# Patient Record
Sex: Male | Born: 1935 | Race: Black or African American | Hispanic: No | Marital: Married | Smoking: Never smoker
Health system: Southern US, Community
[De-identification: ages and names within clinical notes are randomized; demographics above are authoritative.]

## PROBLEM LIST (undated history)

## (undated) ENCOUNTER — Emergency Department (HOSPITAL_BASED_OUTPATIENT_CLINIC_OR_DEPARTMENT_OTHER): Admission: EM | Payer: Self-pay | Source: Home / Self Care

## (undated) DIAGNOSIS — Z9889 Other specified postprocedural states: Secondary | ICD-10-CM

## (undated) DIAGNOSIS — K469 Unspecified abdominal hernia without obstruction or gangrene: Secondary | ICD-10-CM

## (undated) DIAGNOSIS — R519 Headache, unspecified: Secondary | ICD-10-CM

## (undated) DIAGNOSIS — I2510 Atherosclerotic heart disease of native coronary artery without angina pectoris: Secondary | ICD-10-CM

## (undated) DIAGNOSIS — J189 Pneumonia, unspecified organism: Secondary | ICD-10-CM

## (undated) DIAGNOSIS — R112 Nausea with vomiting, unspecified: Secondary | ICD-10-CM

## (undated) DIAGNOSIS — R51 Headache: Secondary | ICD-10-CM

## (undated) DIAGNOSIS — Z973 Presence of spectacles and contact lenses: Secondary | ICD-10-CM

## (undated) DIAGNOSIS — I10 Essential (primary) hypertension: Secondary | ICD-10-CM

## (undated) DIAGNOSIS — K4030 Unilateral inguinal hernia, with obstruction, without gangrene, not specified as recurrent: Secondary | ICD-10-CM

## (undated) DIAGNOSIS — K4090 Unilateral inguinal hernia, without obstruction or gangrene, not specified as recurrent: Secondary | ICD-10-CM

## (undated) DIAGNOSIS — M1990 Unspecified osteoarthritis, unspecified site: Secondary | ICD-10-CM

## (undated) HISTORY — PX: HERNIA REPAIR: SHX51

## (undated) HISTORY — PX: COLONOSCOPY: SHX174

---

## 2001-03-02 ENCOUNTER — Ambulatory Visit (HOSPITAL_COMMUNITY): Admission: RE | Admit: 2001-03-02 | Discharge: 2001-03-02 | Payer: Self-pay | Admitting: Gastroenterology

## 2016-07-06 ENCOUNTER — Encounter (HOSPITAL_COMMUNITY): Payer: Self-pay | Admitting: Emergency Medicine

## 2016-07-06 ENCOUNTER — Emergency Department (HOSPITAL_COMMUNITY)
Admission: EM | Admit: 2016-07-06 | Discharge: 2016-07-07 | Disposition: A | Discharge status: Home / Self Care | Payer: Medicare Other | Attending: Emergency Medicine | Admitting: Emergency Medicine

## 2016-07-06 DIAGNOSIS — K4090 Unilateral inguinal hernia, without obstruction or gangrene, not specified as recurrent: Secondary | ICD-10-CM | POA: Insufficient documentation

## 2016-07-06 DIAGNOSIS — R109 Unspecified abdominal pain: Secondary | ICD-10-CM | POA: Diagnosis present

## 2016-07-06 DIAGNOSIS — I10 Essential (primary) hypertension: Secondary | ICD-10-CM | POA: Insufficient documentation

## 2016-07-06 DIAGNOSIS — I2510 Atherosclerotic heart disease of native coronary artery without angina pectoris: Secondary | ICD-10-CM | POA: Diagnosis not present

## 2016-07-06 HISTORY — DX: Unspecified abdominal hernia without obstruction or gangrene: K46.9

## 2016-07-06 HISTORY — DX: Essential (primary) hypertension: I10

## 2016-07-06 HISTORY — DX: Atherosclerotic heart disease of native coronary artery without angina pectoris: I25.10

## 2016-07-06 LAB — CBC WITH DIFFERENTIAL/PLATELET
BASOS ABS: 0 10*3/uL (ref 0.0–0.1)
Basophils Relative: 0 %
Eosinophils Absolute: 0.2 10*3/uL (ref 0.0–0.7)
Eosinophils Relative: 5 %
HEMATOCRIT: 38 % — AB (ref 39.0–52.0)
Hemoglobin: 12.3 g/dL — ABNORMAL LOW (ref 13.0–17.0)
LYMPHS PCT: 42 %
Lymphs Abs: 2.2 10*3/uL (ref 0.7–4.0)
MCH: 27.9 pg (ref 26.0–34.0)
MCHC: 32.4 g/dL (ref 30.0–36.0)
MCV: 86.2 fL (ref 78.0–100.0)
MONO ABS: 0.3 10*3/uL (ref 0.1–1.0)
Monocytes Relative: 6 %
NEUTROS ABS: 2.5 10*3/uL (ref 1.7–7.7)
Neutrophils Relative %: 47 %
Platelets: 167 10*3/uL (ref 150–400)
RBC: 4.41 MIL/uL (ref 4.22–5.81)
RDW: 13.5 % (ref 11.5–15.5)
WBC: 5.3 10*3/uL (ref 4.0–10.5)

## 2016-07-06 MED ORDER — FENTANYL CITRATE (PF) 100 MCG/2ML IJ SOLN: 50.0000 ug | Freq: Once | INTRAMUSCULAR | Status: DC

## 2016-07-06 MED ORDER — ONDANSETRON HCL 4 MG/2ML IJ SOLN: 4.0000 mg | Freq: Once | INTRAMUSCULAR | Status: DC

## 2016-07-06 NOTE — ED Triage Notes (Signed)
Pt. Stated, I' have a hernia and its ruptured . Ive been nausea and vomiting some. This has been going on for about 2 weeks.

## 2016-07-06 NOTE — ED Notes (Signed)
Lisa PA at bedside

## 2016-07-06 NOTE — ED Provider Notes (Signed)
[facility]-EMERGENCY DEPT Provider Note   CSN: [id number] Arrival date & time: 07/06/16  1821     History   Chief Complaint Chief Complaint  Patient presents with  . Inguinal Hernia  . Emesis  . Abdominal Pain    HPI Reginald Garrison is a 83 y.o. male.  The history is provided by the patient and medical records.  Emesis   Associated symptoms include abdominal pain.  Abdominal Pain   Associated symptoms include vomiting.     83 y.o. M with hx of CAD, hernia, HTN, presenting to the ED for Evaluation of right inguinal hernia and vomiting. Patient reports hernia has been present for several months but has been increasing in size. States he has been wearing a hernia belt to help with this. States if he does not wear it the pain is unbearable. He has not yet sought evaluation with general surgery for this. States over the past 2 weeks he has had some intermittent nausea and vomiting. States this is random when it occurs, not necessarily associated with eating. He has not had any diarrhea. No fever or chills. States he called his PCP about this and was told to come to the ED for evaluation. Does have history of left inguinal hernia repair about 20 years ago in [place]. No other abdominal surgeries.  Past Medical History:  Diagnosis Date  . Coronary artery disease   . Hernia of abdominal cavity   . Hypertension     There are no active problems to display for this patient.   History reviewed. No pertinent surgical history.     Home Medications    Prior to Admission medications   Not on File    Family History No family history on file.  Social History Social History  Substance Use Topics  . Smoking status: Never Smoker  . Smokeless tobacco: Never Used  . Alcohol use No     Allergies   Patient has no allergy information on record.   Review of Systems Review of Systems  Gastrointestinal: Positive for abdominal pain and vomiting.  All other systems reviewed and  are negative.    Physical Exam Updated Vital Signs BP (!) 160/96   Pulse 64   Temp 98.4 F (36.9 C) (Oral)   Resp 17   Ht  (1.753 m)   Wt 72.6 kg   SpO2 100%   BMI 23.63 kg/m   Physical Exam  Constitutional: He is oriented to person, place, and time. He appears well-developed and well-nourished.  HENT:  Head: Normocephalic and atraumatic.  Mouth/Throat: Oropharynx is clear and moist.  Eyes: Conjunctivae and EOM are normal. Pupils are equal, round, and reactive to light.  Neck: Normal range of motion.  Cardiovascular: Normal rate, regular rhythm and normal heart sounds.   Pulmonary/Chest: Effort normal and breath sounds normal. No respiratory distress. He has no wheezes.  Abdominal: Soft. Bowel sounds are normal. There is no tenderness. There is no rebound. A hernia is present. Hernia confirmed positive in the right inguinal area.  Right inguinal hernia present, seems reducible, locally tender, no overlying skin changes Remainder of abdomen is soft and overall benign, no distention noted, bowel sounds decreased  Musculoskeletal: Normal range of motion.  Neurological: He is alert and oriented to person, place, and time.  Skin: Skin is warm and dry.  Psychiatric: He has a normal mood and affect.  Nursing note and vitals reviewed.    ED Treatments / Results  Labs (all labs ordered are  listed, but only abnormal results are displayed) Labs Reviewed  CBC WITH DIFFERENTIAL/PLATELET - Abnormal; Notable for the following:       Result Value   Hemoglobin 12.3 (*)    HCT 38.0 (*)    All other components within normal limits  COMPREHENSIVE METABOLIC PANEL - Abnormal; Notable for the following:    Glucose, Bld 101 (*)    Creatinine, Ser 1.32 (*)    Alkaline Phosphatase 278 (*)    GFR calc non Af Amer 48 (*)    GFR calc Af Amer 55 (*)    All other components within normal limits  LIPASE, BLOOD    EKG  EKG Interpretation None       Radiology Ct Abdomen Pelvis W  Contrast  Result Date: 07/07/2016 CLINICAL DATA:  Initial evaluation for right inguinal hernia, vomiting. EXAM: CT ABDOMEN AND PELVIS WITH CONTRAST TECHNIQUE: Multidetector CT imaging of the abdomen and pelvis was performed using the standard protocol following bolus administration of intravenous contrast. CONTRAST:  ISOVUE-300 IOPAMIDOL (ISOVUE-300) INJECTION 61% COMPARISON:  None. FINDINGS: Lower chest: Scattered atelectatic changes and/or fibrosis present within the visualized lung bases. 4 mm nodule present within the right lower lobe (series 4, image 5). Visualized lung bases are otherwise clear. Hepatobiliary: 12 mm hypodensity within the left hepatic lobe noted, likely a small cyst. Section 4.8 cm cyst present within the inferior right hepatic lobe. Few additional tiny subcentimeter hypodensities too small the characterize, but may reflect small cysts as well. Liver otherwise unremarkable. Gallbladder within normal limits. No biliary dilatation. Pancreas: Pancreas within normal limits. Spleen: Spleen within normal limits. Adrenals/Urinary Tract: Kidneys equal in size with symmetric enhancement. Multiple bilateral renal cysts noted. No nephrolithiasis or hydronephrosis. No focal enhancing renal mass. No hydroureter. Bladder within normal limits. Stomach/Bowel: Stomach within normal limits. Small bowel of normal caliber without acute inflammatory changes. No evidence for small bowel obstruction. Right inguinal hernia containing a portion of colon and loop of ileum present within the right inguinal region (series 3, image 90). No associated obstruction. Small amount of probable free fluid within the hernia sac without additional inflammatory changes. No abnormal wall thickening, mucosal enhancement, or inflammatory fat stranding seen about the bowels. Moderate retained stool within the colon, which may reflect constipation. Vascular/Lymphatic: Extensive aorto bi-iliac atherosclerotic disease. Aneurysmal  dilatation of the infrarenal aorta up to 3.7 cm in diameter. Aneurysm dilatation of the right common iliac artery up to 2.3 cm. Left common iliac artery dilated up to 2.4 cm. No pathologically enlarged intra-abdominal or pelvic lymph nodes. Reproductive: Prostate within normal limits. Other: No free air or fluid. Musculoskeletal: No acute osseous abnormality. No worrisome lytic or blastic osseous lesions. Asymmetric coarse trabecular thickening involving the left hemipelvis most likely related to underlying Paget's disease. Similar changes involve the L3 vertebral body as well as its posterior elements. Height loss at the L3 level appears chronic. No associated soft tissue mass to suggest malignant degeneration. IMPRESSION: 1. Right inguinal hernia containing loops of colon and ileum without associated obstruction. Probable small amount of associated free fluid within the hernia sac without additional inflammatory changes. 2. No other acute intra-abdominal or pelvic process identified. 3. **An incidental finding of potential clinical significance has been found. Aneurysmal dilatation of the infrarenal aorta up to 3.7 cm. Recommend followup by ultrasound in 2 years. This recommendation follows ACR consensus guidelines: White Paper of the ACR Incidental Findings Committee II on Vascular Findings. J Am Coll Radiol 2013; 10:789-794.** 4. Additional bilateral common iliac  artery aneurysms as above. 5. Moderate amount of retained stool within the colon, which may reflect constipation. 6. Scattered cysts involving the liver and bilateral kidneys as above. 7. Pagetoid changes involving the left hemipelvis and L3 vertebral body. Height loss at the L3 level appears chronic. 8. 4 mm right lower lobe pulmonary nodule, indeterminate. No follow-up needed if patient is low-risk. Non-contrast chest CT can be considered in 12 months if patient is high-risk. This recommendation follows the consensus statement: Guidelines for  Management of Incidental Pulmonary Nodules Detected on CT Images: From the Fleischner Society 2017; Radiology 2017; 284:228-243. Electronically Signed   By: Rise Mu M.D.   On: 07/07/2016 01:55    Procedures Procedures (including critical care time)  Medications Ordered in ED Medications  fentaNYL (SUBLIMAZE) injection 50 mcg (50 mcg Intravenous Refused 07/07/16 0019)  ondansetron (ZOFRAN) injection 4 mg (4 mg Intravenous Refused 07/07/16 0019)  iopamidol (ISOVUE-300) 61 % injection (100 mLs  Contrast Given 07/07/16 0109)  diphenhydrAMINE (BENADRYL) injection 25 mg (25 mg Intravenous Given 07/07/16 0127)  methylPREDNISolone sodium succinate (SOLU-MEDROL) 125 mg/2 mL injection 125 mg (125 mg Intravenous Given 07/07/16 0127)     Initial Impression / Assessment and Plan / ED Course  I have reviewed the triage vital signs and the nursing notes.  Pertinent labs & imaging results that were available during my care of the patient were reviewed by me and considered in my medical decision making (see chart for details).  83 year old male with right inguinal hernia. Reports this has been increasing over the past 2 months. Reports some intermittent nausea and vomiting over the past 2 weeks. Sent here by PCP for further evaluation. Here patient is afebrile and nontoxic. His hernia is sizable but is reducible and soft in exam. No overlying skin changes. Remainder of abdomen is soft and benign. Screening lab work is overall reassuring. CT scan pending for evaluation of possible obstruction/strangulation.  1:25 AM Called into patient's room after CT.  He has some hives of his face and neck.  He denies any difficulty swallowing or sensation of throat closing. His airway remains widely patent.  He is handling secretions well, normal phonation without stridor.  VSS.  No signs of anaphylaxis at this time.  Will give IV solu-medrol and benadryl.  Will monitor closely.  CT scan resulted-- hernia does  contain loops of bowel but no signs of obstruction or strangulation.  No inflammatory changes.  Has some other incidental findings-- see above.  Patient remains comfortable here, actually refused pain medication. After his IV Solu-Medrol and Benadryl, hives have resolved.  Monitored here 2 hours post contrast administration and he remains without any airway compromise or oral swelling.  Feel he is stable for discharge.  Will have him follow-up with the general surgery clinic.  Rx percocet, zofran PRN.  Encouraged to follow-up with PCP as well.  Discussed plan with patient and family, they acknowledged understanding and agreed with plan of care.  Return precautions given for new or worsening symptoms.  Final Clinical Impressions(s) / ED Diagnoses   Final diagnoses:  Inguinal hernia of right side without obstruction or gangrene    New Prescriptions Discharge Medication List as of 07/07/2016  3:30 AM    START taking these medications   Details  ondansetron (ZOFRAN ODT) 4 MG disintegrating tablet Take 1 tablet (4 mg total) by mouth every 8 (eight) hours as needed for nausea., Starting Tue 07/07/2016, Print    oxyCODONE-acetaminophen (PERCOCET) 5-325 MG tablet Take 1 tablet  by mouth every 4 (four) hours as needed., Starting Tue 07/07/2016, Print         Garlon Hatchet, PA-C 07/07/16 0405    Laurence Spates, MD 07/09/16 [phone]

## 2016-07-07 ENCOUNTER — Emergency Department (HOSPITAL_COMMUNITY): Payer: Medicare Other

## 2016-07-07 LAB — COMPREHENSIVE METABOLIC PANEL
ALBUMIN: 3.6 g/dL (ref 3.5–5.0)
ALT: 20 U/L (ref 17–63)
AST: 41 U/L (ref 15–41)
Alkaline Phosphatase: 278 U/L — ABNORMAL HIGH (ref 38–126)
Anion gap: 8 (ref 5–15)
BUN: 20 mg/dL (ref 6–20)
CHLORIDE: 103 mmol/L (ref 101–111)
CO2: 28 mmol/L (ref 22–32)
CREATININE: 1.32 mg/dL — AB (ref 0.61–1.24)
Calcium: 9.5 mg/dL (ref 8.9–10.3)
GFR calc Af Amer: 55 mL/min — ABNORMAL LOW (ref >60)
GFR calc non Af Amer: 48 mL/min — ABNORMAL LOW (ref >60)
Glucose, Bld: 101 mg/dL — ABNORMAL HIGH (ref 65–99)
POTASSIUM: 3.8 mmol/L (ref 3.5–5.1)
SODIUM: 139 mmol/L (ref 135–145)
Total Bilirubin: 0.5 mg/dL (ref 0.3–1.2)
Total Protein: 6.7 g/dL (ref 6.5–8.1)

## 2016-07-07 LAB — LIPASE, BLOOD: LIPASE: 19 U/L (ref 11–51)

## 2016-07-07 MED ORDER — DIPHENHYDRAMINE HCL 50 MG/ML IJ SOLN
25.0000 mg | Freq: Once | INTRAMUSCULAR | Status: AC
  Administered 2016-07-07: 25 mg via INTRAVENOUS

## 2016-07-07 MED ORDER — OXYCODONE-ACETAMINOPHEN 5-325 MG PO TABS: 1.0000 | ORAL_TABLET | ORAL | 0 refills | Status: AC | PRN

## 2016-07-07 MED ORDER — METHYLPREDNISOLONE SODIUM SUCC 125 MG IJ SOLR
125.0000 mg | Freq: Once | INTRAMUSCULAR | Status: AC
  Administered 2016-07-07: 125 mg via INTRAVENOUS

## 2016-07-07 MED ORDER — ONDANSETRON 4 MG PO TBDP: 4.0000 mg | ORAL_TABLET | Freq: Three times a day (TID) | ORAL | 0 refills | Status: AC | PRN

## 2016-07-07 MED ORDER — IOPAMIDOL (ISOVUE-300) INJECTION 61%
INTRAVENOUS | Status: AC
  Administered 2016-07-07: 100 mL
  Filled 2016-07-07: qty 100

## 2016-07-07 MED ORDER — METHYLPREDNISOLONE SODIUM SUCC 125 MG IJ SOLR
INTRAMUSCULAR | Status: AC
  Filled 2016-07-07: qty 2

## 2016-07-07 NOTE — ED Notes (Signed)
Pt returned from CT, redness noted to chest, hives to forehead and cheeks. PA Sanders notified, see new orders.

## 2016-07-07 NOTE — Discharge Instructions (Signed)
Take zofran for nausea.  Take percocet for pain.   Do not drive while taking pain medication, it can make you drowsy. Follow-up with the surgery clinic--- I recommend to call tomorrow.  As we discussed, they may want you to have medical clearance from your doctor but you can discuss this with them at consultation. Return to the ED for new or worsening symptoms.

## 2016-07-07 NOTE — ED Notes (Signed)
Continuing to refuse pain/nausea medications.

## 2016-07-07 NOTE — ED Notes (Signed)
Redness/hives improving. Pt continuing to deny shob

## 2016-07-07 NOTE — ED Notes (Signed)
To CT at this time on cardiac monitor 

## 2016-07-07 NOTE — ED Notes (Signed)
Patient left at this time with all belongings. 

## 2016-07-08 ENCOUNTER — Other Ambulatory Visit: Payer: Self-pay | Admitting: General Surgery

## 2016-07-13 ENCOUNTER — Encounter (HOSPITAL_COMMUNITY): Payer: Self-pay | Admitting: *Deleted

## 2016-07-13 NOTE — Progress Notes (Signed)
Pt denies SOB, chest pain, and being under the care of a cardiologist. Pt stated that he was " released from his cardiologist > 5 years ago." Pt stated that he thinks that the cardiologist name was Dr. Katrinka Blazing. Pt denies having a cardiac cath but stated that if an echo and stress test were performed , " it was well over 5 years ago." Pt stated that his PCP is Dr. Gabriel Earing in [place]. Records requested from PCP and Dr. Katrinka Blazing. Pt made aware to stop taking Aspirin, vitamins, fish oil and herbal medications. Do not take any NSAIDs ie: Ibuprofen, Advil, Naproxen, BC and Goody Powder or any medication containing Aspirin. Pt verbalized understanding of all pre-op instructions.

## 2016-07-15 NOTE — H&P (Signed)
Reginald Garrison Location: [facility] Surgery Patient #: [id number] DOB: Nov 14, 1929 Married / Language: English / Race: Black or African American Male       History of Present Illness        The patient is a 83 year old male who presents with an inguinal hernia. This is a very pleasant 83 year old African-American man, here with his daughter and son. His PCP is Dr. Gabriel Earing in [place]. He was actually referred by Garlon Hatchet, PA, in the emergency department. I do not see that a physician saw the patient       He has a remote history of left inguinal hernia repair 20 years ago. No symptoms there. He's had a bulge in his right groin for many years. He wears a truss. It has slowly gotten larger. He recently had an episode of severe pain nausea and vomiting and went to the emergency department. The hernia was slowly reduced and he felt fine. CT scan showed a right inguinal hernia containing loops of colon and small bowel without obstruction. Incidentally found was infrarenal aorta dilated to 3.7 cm in follow-up in 2 years with ultrasound was recommended. Incidental bilateral common iliac aneurysms noted. 4 mm right lower lobe pulmonary nodule, indeterminate. 12 month follow-up CT if at high risk. Patient feels fine today and has no complaints today. He thought was given to the operation in the office today.      Past history left inguinal hernia. 20 years ago. Hypertension. No cardiac, pulmonary, or endocrine disorders. He is pretty healthy. Family history reveals that mother and father are deceased. He doesn't know anything about her medical problems Social history reveals that he is married. Lives with his wife. Independent. Glasses on call. Does his own yard work. They have 6 children live nearby. Denies alcohol or tobacco. 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      I advised him to undergo open repair of his giant right inguinal hernia  with mesh in the near future and he agrees. He knows this is to prevent incarceration and obstruction and strangulation.  He will be scheduled for open repair of right inguinal hernia with mesh. I discussed the indications, details, techniques, and numerous risk of the surgery with him and his children. I went over patient information booklets with drawings diagrams. He is aware of the risk of bleeding, infection, recurrence of the hernia, injury to the bowel or intestine or testicle, recurrent nerve damage is chronic pain, cardiac pulmonary thromboembolic problems, urinary retention. He understands all these issues. All his questions are answered. He agrees with this plan.  He wants to do this at [facility] which is fine I'm going to keep him overnight because of the risk of urinary retention bleeding and pain We'll ask for a TAPP block.   Past Surgical History Open Inguinal Hernia Surgery  Left.  Diagnostic Studies History Colonoscopy  5-10 years ago  Allergies  Contrast Media Ready-Box *MEDICAL DEVICES AND SUPPLIES*   Medication History  Lisinopril (  Tablet, Oral) Active. Metoprolol Succinate ER (  Tablet ER 24HR, Oral) Active. Aspirin (  Tablet Chewable, Oral) Active. Medications Reconciled  Social History  No alcohol use  No caffeine use  No drug use  Tobacco use  Never smoker.  Other Problems  High blood pressure  Inguinal Hernia     Review of Systems  General Present- Appetite Loss, Chills, Fatigue and Fever. Not Present- Night Sweats, Weight Gain and Weight Loss. Skin Not  Present- Change in Wart/Mole, Dryness, Hives, Jaundice, New Lesions, Non-Healing Wounds, Rash and Ulcer. HEENT Present- Seasonal Allergies and Wears glasses/contact lenses. Not Present- Earache, Hearing Loss, Hoarseness, Nose Bleed, Oral Ulcers, Ringing in the Ears, Sinus Pain, Sore Throat, Visual Disturbances and Yellow Eyes. Respiratory Not Present- Bloody sputum,  Chronic Cough, Difficulty Breathing, Snoring and Wheezing. Breast Not Present- Breast Mass, Breast Pain, Nipple Discharge and Skin Changes. Cardiovascular Not Present- Chest Pain, Difficulty Breathing Lying Down, Leg Cramps, Palpitations, Rapid Heart Rate, Shortness of Breath and Swelling of Extremities. Gastrointestinal Present- Abdominal Pain. Not Present- Bloating, Bloody Stool, Change in Bowel Habits, Chronic diarrhea, Constipation, Difficulty Swallowing, Excessive gas, Gets full quickly at meals, Hemorrhoids, Indigestion, Nausea, Rectal Pain and Vomiting. Male Genitourinary Not Present- Blood in Urine, Change in Urinary Stream, Frequency, Impotence, Nocturia, Painful Urination, Urgency and Urine Leakage.  Vitals Weight: 154 lb Height: 69in Body Surface Area: 1.85 m Body Mass Index: 22.74 kg/m  Temp.: 98.71F  BP: 140/70 (Sitting, Left Arm, Standard)    Physical Exam  General Mental Status-Alert. General Appearance-Consistent with stated age. Hydration-Well hydrated. Voice-Normal. Note: Extremely pleasant. No distress. Thin. BMI 22   Head and Neck Head-normocephalic, atraumatic with no lesions or palpable masses. Trachea-midline.  Eye Eyeball - Bilateral-Extraocular movements intact. Sclera/Conjunctiva - Bilateral-No scleral icterus.  Chest and Lung Exam Chest and lung exam reveals -quiet, even and easy respiratory effort with no use of accessory muscles and on auscultation, normal breath sounds, no adventitious sounds and normal vocal resonance. Inspection Chest Wall - Normal. Back - normal.  Cardiovascular Cardiovascular examination reveals -normal heart sounds, regular rate and rhythm with no murmurs and normal pedal pulses bilaterally.  Abdomen Inspection Skin - Scar - no surgical scars. Hernias - Ventral - Reducible. Inguinal hernia - Left - Reducible. Palpation/Percussion Palpation and Percussion of the abdomen reveal - Soft, Non  Tender, No Rebound tenderness, No Rigidity (guarding) and No hepatosplenomegaly. Auscultation Auscultation of the abdomen reveals - Bowel sounds normal.  Male Genitourinary Note: Examined supine and standing. Left inguinal area is normal. No recurrence. Right inguinal and scrotal area reveals a right inguinal hernia into the scrotum the size of an acorn squash. Supine I can slowly but completely reduce this. Both testicles feel normal. Femoral pulses are very prominent but not obviously aneurysmal. No inguinal adenopathy. Penis looks normal.   Neurologic Neurologic evaluation reveals -alert and oriented x 3 with no impairment of recent or remote memory. Mental Status-Normal.  Musculoskeletal Normal Exam - Left-Upper Extremity Strength Normal and Lower Extremity Strength Normal. Normal Exam - Right-Upper Extremity Strength Normal, Lower Extremity Weakness.    Assessment & Plan  INGUINAL HERNIA OF RIGHT SIDE WITH OBSTRUCTION (K40.30)    You have a very large right inguinal hernia. Fortunately, we can slowly but completely reduce this I have strongly recommended surgery to repair the hernia to avoid an emergency should obstruction or strangulation You agree with this  you will be scheduled for open repair of right inguinal hernia with mesh We will keep you in the hospital one night to observe for bleeding, pain, and difficulty urinating We have discussed the indications, techniques, and risks of this surgery in detail Please read the information booklet that I reviewed with you  At your request, we will schedule this at [facility]  HYPERTENSION, BENIGN (I10) HISTORY OF LEFT INGUINAL HERNIA REPAIR (Z98.890)    Angelia Mould. Derrell Lolling, M.D., [facility] General and Minimally invasive Surgery Breast and Colorectal Surgery Office:   [phone]  Pager:   [phone]

## 2016-07-16 ENCOUNTER — Encounter (HOSPITAL_COMMUNITY)
Admission: RE | Disposition: A | Discharge status: Home / Self Care | Payer: Self-pay | Source: Ambulatory Visit | Attending: General Surgery

## 2016-07-16 ENCOUNTER — Ambulatory Visit (HOSPITAL_COMMUNITY): Payer: Medicare Other | Admitting: Anesthesiology

## 2016-07-16 ENCOUNTER — Encounter (HOSPITAL_COMMUNITY): Payer: Self-pay | Admitting: Anesthesiology

## 2016-07-16 ENCOUNTER — Ambulatory Visit (HOSPITAL_COMMUNITY)
Admission: RE | Admit: 2016-07-16 | Discharge: 2016-07-17 | Disposition: A | Discharge status: Home / Self Care | Payer: Medicare Other | Source: Ambulatory Visit | Attending: General Surgery | Admitting: General Surgery

## 2016-07-16 DIAGNOSIS — Z79899 Other long term (current) drug therapy: Secondary | ICD-10-CM | POA: Diagnosis not present

## 2016-07-16 DIAGNOSIS — I10 Essential (primary) hypertension: Secondary | ICD-10-CM | POA: Diagnosis not present

## 2016-07-16 DIAGNOSIS — K4030 Unilateral inguinal hernia, with obstruction, without gangrene, not specified as recurrent: Secondary | ICD-10-CM | POA: Diagnosis not present

## 2016-07-16 DIAGNOSIS — L728 Other follicular cysts of the skin and subcutaneous tissue: Secondary | ICD-10-CM | POA: Insufficient documentation

## 2016-07-16 DIAGNOSIS — Z7982 Long term (current) use of aspirin: Secondary | ICD-10-CM | POA: Diagnosis not present

## 2016-07-16 HISTORY — DX: Headache, unspecified: R51.9

## 2016-07-16 HISTORY — PX: INSERTION OF MESH: SHX5868

## 2016-07-16 HISTORY — DX: Unilateral inguinal hernia, with obstruction, without gangrene, not specified as recurrent: K40.30

## 2016-07-16 HISTORY — DX: Other specified postprocedural states: Z98.890

## 2016-07-16 HISTORY — DX: Headache: R51

## 2016-07-16 HISTORY — PX: INGUINAL HERNIA REPAIR: SHX194

## 2016-07-16 HISTORY — DX: Pneumonia, unspecified organism: J18.9

## 2016-07-16 HISTORY — DX: Unspecified osteoarthritis, unspecified site: M19.90

## 2016-07-16 HISTORY — DX: Unilateral inguinal hernia, without obstruction or gangrene, not specified as recurrent: K40.90

## 2016-07-16 HISTORY — DX: Nausea with vomiting, unspecified: R11.2

## 2016-07-16 HISTORY — DX: Presence of spectacles and contact lenses: Z97.3

## 2016-07-16 LAB — CBC WITH DIFFERENTIAL/PLATELET
BASOS ABS: 0 10*3/uL (ref 0.0–0.1)
Basophils Relative: 0 %
EOS PCT: 3 %
Eosinophils Absolute: 0.2 10*3/uL (ref 0.0–0.7)
HEMATOCRIT: 41 % (ref 39.0–52.0)
HEMOGLOBIN: 13.2 g/dL (ref 13.0–17.0)
LYMPHS ABS: 3 10*3/uL (ref 0.7–4.0)
Lymphocytes Relative: 50 %
MCH: 28 pg (ref 26.0–34.0)
MCHC: 32.2 g/dL (ref 30.0–36.0)
MCV: 86.9 fL (ref 78.0–100.0)
Monocytes Absolute: 0.4 10*3/uL (ref 0.1–1.0)
Monocytes Relative: 7 %
NEUTROS ABS: 2.4 10*3/uL (ref 1.7–7.7)
Neutrophils Relative %: 40 %
Platelets: 201 10*3/uL (ref 150–400)
RBC: 4.72 MIL/uL (ref 4.22–5.81)
RDW: 13.4 % (ref 11.5–15.5)
WBC: 6 10*3/uL (ref 4.0–10.5)

## 2016-07-16 LAB — COMPREHENSIVE METABOLIC PANEL
ALK PHOS: 308 U/L — AB (ref 38–126)
ALT: 21 U/L (ref 17–63)
AST: 38 U/L (ref 15–41)
Albumin: 3.9 g/dL (ref 3.5–5.0)
Anion gap: 10 (ref 5–15)
BILIRUBIN TOTAL: 0.9 mg/dL (ref 0.3–1.2)
BUN: 20 mg/dL (ref 6–20)
CALCIUM: 9.5 mg/dL (ref 8.9–10.3)
CHLORIDE: 105 mmol/L (ref 101–111)
CO2: 25 mmol/L (ref 22–32)
CREATININE: 1.23 mg/dL (ref 0.61–1.24)
GFR, EST NON AFRICAN AMERICAN: 52 mL/min — AB (ref >60)
Glucose, Bld: 92 mg/dL (ref 65–99)
Potassium: 3.7 mmol/L (ref 3.5–5.1)
Sodium: 140 mmol/L (ref 135–145)
TOTAL PROTEIN: 7.3 g/dL (ref 6.5–8.1)

## 2016-07-16 SURGERY — REPAIR, HERNIA, INGUINAL, ADULT
Anesthesia: General | Site: Groin | Laterality: Right

## 2016-07-16 MED ORDER — HYDROMORPHONE HCL 1 MG/ML IJ SOLN: 0.5000 mg | INTRAMUSCULAR | Status: DC | PRN

## 2016-07-16 MED ORDER — CHLORHEXIDINE GLUCONATE CLOTH 2 % EX PADS: 6.0000 | MEDICATED_PAD | Freq: Once | CUTANEOUS | Status: DC

## 2016-07-16 MED ORDER — FENTANYL CITRATE (PF) 100 MCG/2ML IJ SOLN
INTRAMUSCULAR | Status: AC
  Administered 2016-07-16: 50 ug
  Filled 2016-07-16: qty 2

## 2016-07-16 MED ORDER — CEFAZOLIN SODIUM-DEXTROSE 2-4 GM/100ML-% IV SOLN
2.0000 g | Freq: Three times a day (TID) | INTRAVENOUS | Status: AC
  Administered 2016-07-16: 2 g via INTRAVENOUS
  Filled 2016-07-16: qty 100

## 2016-07-16 MED ORDER — MIDAZOLAM HCL 2 MG/2ML IJ SOLN
INTRAMUSCULAR | Status: AC
  Filled 2016-07-16: qty 2

## 2016-07-16 MED ORDER — ONDANSETRON HCL 4 MG/2ML IJ SOLN
INTRAMUSCULAR | Status: AC
  Filled 2016-07-16: qty 2

## 2016-07-16 MED ORDER — BUPIVACAINE HCL (PF) 0.5 % IJ SOLN
INTRAMUSCULAR | Status: AC
  Filled 2016-07-16: qty 30

## 2016-07-16 MED ORDER — ACETAMINOPHEN 10 MG/ML IV SOLN: 1000.0000 mg | Freq: Once | INTRAVENOUS | Status: DC | PRN

## 2016-07-16 MED ORDER — SUGAMMADEX SODIUM 200 MG/2ML IV SOLN
INTRAVENOUS | Status: DC | PRN
  Administered 2016-07-16: 200 mg via INTRAVENOUS

## 2016-07-16 MED ORDER — SUGAMMADEX SODIUM 200 MG/2ML IV SOLN
INTRAVENOUS | Status: AC
  Filled 2016-07-16: qty 4

## 2016-07-16 MED ORDER — LIDOCAINE 2% (20 MG/ML) 5 ML SYRINGE
INTRAMUSCULAR | Status: AC
  Filled 2016-07-16: qty 5

## 2016-07-16 MED ORDER — LIDOCAINE HCL (CARDIAC) 20 MG/ML IV SOLN
INTRAVENOUS | Status: DC | PRN
  Administered 2016-07-16: 100 mg via INTRAVENOUS

## 2016-07-16 MED ORDER — ROCURONIUM BROMIDE 100 MG/10ML IV SOLN
INTRAVENOUS | Status: DC | PRN
  Administered 2016-07-16: 50 mg via INTRAVENOUS
  Administered 2016-07-16: 20 mg via INTRAVENOUS
  Administered 2016-07-16: 10 mg via INTRAVENOUS

## 2016-07-16 MED ORDER — PHENYLEPHRINE HCL 10 MG/ML IJ SOLN
INTRAMUSCULAR | Status: DC | PRN
  Administered 2016-07-16: 40 ug via INTRAVENOUS
  Administered 2016-07-16 (×2): 80 ug via INTRAVENOUS

## 2016-07-16 MED ORDER — ONDANSETRON HCL 4 MG/2ML IJ SOLN: 4.0000 mg | Freq: Four times a day (QID) | INTRAMUSCULAR | Status: DC | PRN

## 2016-07-16 MED ORDER — FENTANYL CITRATE (PF) 250 MCG/5ML IJ SOLN
INTRAMUSCULAR | Status: AC
  Filled 2016-07-16: qty 5

## 2016-07-16 MED ORDER — ONDANSETRON HCL 4 MG/2ML IJ SOLN
INTRAMUSCULAR | Status: DC | PRN
  Administered 2016-07-16 (×2): 4 mg via INTRAVENOUS

## 2016-07-16 MED ORDER — ONDANSETRON 4 MG PO TBDP: 4.0000 mg | ORAL_TABLET | Freq: Four times a day (QID) | ORAL | Status: DC | PRN

## 2016-07-16 MED ORDER — 0.9 % SODIUM CHLORIDE (POUR BTL) OPTIME
TOPICAL | Status: DC | PRN
  Administered 2016-07-16: 1000 mL

## 2016-07-16 MED ORDER — FENTANYL CITRATE (PF) 100 MCG/2ML IJ SOLN: 25.0000 ug | INTRAMUSCULAR | Status: DC | PRN

## 2016-07-16 MED ORDER — FENTANYL CITRATE (PF) 100 MCG/2ML IJ SOLN
INTRAMUSCULAR | Status: DC | PRN
  Administered 2016-07-16: 100 ug via INTRAVENOUS

## 2016-07-16 MED ORDER — ACETAMINOPHEN 325 MG PO TABS
650.0000 mg | ORAL_TABLET | Freq: Four times a day (QID) | ORAL | Status: DC | PRN
Start: 2016-07-16 — End: 2016-07-17

## 2016-07-16 MED ORDER — MEPERIDINE HCL 25 MG/ML IJ SOLN: 6.2500 mg | INTRAMUSCULAR | Status: DC | PRN

## 2016-07-16 MED ORDER — PHENYLEPHRINE HCL 10 MG/ML IJ SOLN
INTRAVENOUS | Status: DC | PRN
  Administered 2016-07-16: 50 ug/min via INTRAVENOUS

## 2016-07-16 MED ORDER — BISACODYL 5 MG PO TBEC: 5.0000 mg | DELAYED_RELEASE_TABLET | Freq: Every day | ORAL | Status: DC | PRN

## 2016-07-16 MED ORDER — PROPOFOL 10 MG/ML IV BOLUS
INTRAVENOUS | Status: DC | PRN
  Administered 2016-07-16: 170 mg via INTRAVENOUS

## 2016-07-16 MED ORDER — SENNA 8.6 MG PO TABS
1.0000 | ORAL_TABLET | Freq: Two times a day (BID) | ORAL | Status: DC
  Administered 2016-07-16: 8.6 mg via ORAL
  Filled 2016-07-16: qty 1

## 2016-07-16 MED ORDER — ENOXAPARIN SODIUM 40 MG/0.4ML ~~LOC~~ SOLN
40.0000 mg | SUBCUTANEOUS | Status: DC
Start: 2016-07-17 — End: 2016-07-17

## 2016-07-16 MED ORDER — SUGAMMADEX SODIUM 200 MG/2ML IV SOLN
INTRAVENOUS | Status: AC
Start: 2016-07-16 — End: 2016-07-16
  Filled 2016-07-16: qty 2

## 2016-07-16 MED ORDER — LACTATED RINGERS IV SOLN
INTRAVENOUS | Status: DC
  Administered 2016-07-16: 18:00:00 via INTRAVENOUS

## 2016-07-16 MED ORDER — ACETAMINOPHEN 650 MG RE SUPP
650.0000 mg | Freq: Four times a day (QID) | RECTAL | Status: DC | PRN
Start: 2016-07-16 — End: 2016-07-17

## 2016-07-16 MED ORDER — EPHEDRINE SULFATE 50 MG/ML IJ SOLN
INTRAMUSCULAR | Status: DC | PRN
  Administered 2016-07-16 (×2): 5 mg via INTRAVENOUS

## 2016-07-16 MED ORDER — FENTANYL CITRATE (PF) 100 MCG/2ML IJ SOLN
50.0000 ug | Freq: Once | INTRAMUSCULAR | Status: AC
  Administered 2016-07-16: 50 ug via INTRAVENOUS

## 2016-07-16 MED ORDER — LACTATED RINGERS IV SOLN
INTRAVENOUS | Status: DC
  Administered 2016-07-16: 11:00:00 via INTRAVENOUS

## 2016-07-16 MED ORDER — BUPIVACAINE HCL (PF) 0.5 % IJ SOLN
INTRAMUSCULAR | Status: DC | PRN
  Administered 2016-07-16: 10 mL

## 2016-07-16 MED ORDER — LACTATED RINGERS IV SOLN
INTRAVENOUS | Status: DC | PRN
  Administered 2016-07-16: 14:00:00 via INTRAVENOUS

## 2016-07-16 MED ORDER — CEFAZOLIN SODIUM-DEXTROSE 2-4 GM/100ML-% IV SOLN
2.0000 g | INTRAVENOUS | Status: AC
  Administered 2016-07-16: 2 g via INTRAVENOUS
  Filled 2016-07-16: qty 100

## 2016-07-16 MED ORDER — ROCURONIUM BROMIDE 10 MG/ML (PF) SYRINGE
PREFILLED_SYRINGE | INTRAVENOUS | Status: AC
  Filled 2016-07-16: qty 5

## 2016-07-16 MED ORDER — ONDANSETRON HCL 4 MG/2ML IJ SOLN: 4.0000 mg | Freq: Once | INTRAMUSCULAR | Status: DC | PRN

## 2016-07-16 MED ORDER — HYDROCODONE-ACETAMINOPHEN 5-325 MG PO TABS
1.0000 | ORAL_TABLET | ORAL | Status: DC | PRN
  Administered 2016-07-17: 1 via ORAL
  Filled 2016-07-16: qty 1

## 2016-07-16 MED ORDER — LISINOPRIL 20 MG PO TABS
20.0000 mg | ORAL_TABLET | Freq: Every evening | ORAL | Status: DC
  Administered 2016-07-16: 20 mg via ORAL
  Filled 2016-07-16: qty 1

## 2016-07-16 MED ORDER — PHENYLEPHRINE 40 MCG/ML (10ML) SYRINGE FOR IV PUSH (FOR BLOOD PRESSURE SUPPORT)
PREFILLED_SYRINGE | INTRAVENOUS | Status: AC
  Filled 2016-07-16: qty 20

## 2016-07-16 MED ORDER — METOPROLOL SUCCINATE ER 25 MG PO TB24: 25.0000 mg | ORAL_TABLET | Freq: Every day | ORAL | Status: DC

## 2016-07-16 MED ORDER — EPHEDRINE 5 MG/ML INJ
INTRAVENOUS | Status: AC
  Filled 2016-07-16: qty 10

## 2016-07-16 SURGICAL SUPPLY — 45 items
BLADE CLIPPER SURG (BLADE) IMPLANT
BLADE SURG 10 STRL SS (BLADE) ×2 IMPLANT
BLADE SURG 15 STRL LF DISP TIS (BLADE) ×1 IMPLANT
BLADE SURG 15 STRL SS (BLADE) ×1
CANISTER SUCT 3000ML PPV (MISCELLANEOUS) ×2 IMPLANT
CHLORAPREP W/TINT 26ML (MISCELLANEOUS) ×2 IMPLANT
COVER SURGICAL LIGHT HANDLE (MISCELLANEOUS) ×2 IMPLANT
DERMABOND ADVANCED (GAUZE/BANDAGES/DRESSINGS) ×1
DERMABOND ADVANCED .7 DNX12 (GAUZE/BANDAGES/DRESSINGS) ×1 IMPLANT
DRAIN PENROSE 1/2X12 LTX STRL (WOUND CARE) IMPLANT
DRAPE LAPAROTOMY TRNSV 102X78 (DRAPE) ×2 IMPLANT
DRAPE UTILITY XL STRL (DRAPES) ×2 IMPLANT
ELECT CAUTERY BLADE 6.4 (BLADE) ×2 IMPLANT
ELECT REM PT RETURN 9FT ADLT (ELECTROSURGICAL) ×2
ELECTRODE REM PT RTRN 9FT ADLT (ELECTROSURGICAL) ×1 IMPLANT
GLOVE EUDERMIC 7 POWDERFREE (GLOVE) ×2 IMPLANT
GOWN STRL REUS W/ TWL LRG LVL3 (GOWN DISPOSABLE) ×1 IMPLANT
GOWN STRL REUS W/ TWL XL LVL3 (GOWN DISPOSABLE) ×1 IMPLANT
GOWN STRL REUS W/TWL LRG LVL3 (GOWN DISPOSABLE) ×2
GOWN STRL REUS W/TWL XL LVL3 (GOWN DISPOSABLE) ×2
KIT BASIN OR (CUSTOM PROCEDURE TRAY) ×2 IMPLANT
KIT ROOM TURNOVER OR (KITS) ×2 IMPLANT
MESH ULTRAPRO 3X6 7.6X15CM (Mesh General) ×2 IMPLANT
NEEDLE HYPO 25GX1X1/2 BEV (NEEDLE) ×2 IMPLANT
NS IRRIG 1000ML POUR BTL (IV SOLUTION) ×2 IMPLANT
PACK SURGICAL SETUP 50X90 (CUSTOM PROCEDURE TRAY) ×2 IMPLANT
PAD ARMBOARD 7.5X6 YLW CONV (MISCELLANEOUS) ×2 IMPLANT
PENCIL BUTTON HOLSTER BLD 10FT (ELECTRODE) ×2 IMPLANT
SPONGE LAP 18X18 X RAY DECT (DISPOSABLE) ×2 IMPLANT
SUT MNCRL AB 4-0 PS2 18 (SUTURE) ×2 IMPLANT
SUT PROLENE 2 0 CT2 30 (SUTURE) ×10 IMPLANT
SUT SILK 2 0 (SUTURE) ×2
SUT SILK 2 0 SH (SUTURE) IMPLANT
SUT SILK 2-0 18XBRD TIE 12 (SUTURE) ×1 IMPLANT
SUT VIC AB 2-0 BRD 54 (SUTURE) ×2 IMPLANT
SUT VIC AB 2-0 CT1 27 (SUTURE) ×6
SUT VIC AB 2-0 CT1 TAPERPNT 27 (SUTURE) ×3 IMPLANT
SUT VIC AB 3-0 SH 27 (SUTURE) ×2
SUT VIC AB 3-0 SH 27XBRD (SUTURE) ×1 IMPLANT
SYR BULB 3OZ (MISCELLANEOUS) ×2 IMPLANT
SYR CONTROL 10ML LL (SYRINGE) ×2 IMPLANT
TOWEL OR 17X24 6PK STRL BLUE (TOWEL DISPOSABLE) ×2 IMPLANT
TOWEL OR 17X26 10 PK STRL BLUE (TOWEL DISPOSABLE) ×2 IMPLANT
TUBE CONNECTING 12X1/4 (SUCTIONS) ×2 IMPLANT
YANKAUER SUCT BULB TIP NO VENT (SUCTIONS) ×2 IMPLANT

## 2016-07-16 NOTE — Anesthesia Preprocedure Evaluation (Signed)
Anesthesia Evaluation  Patient identified by MRN, date of birth, ID band Patient awake    Reviewed: Allergy & Precautions, NPO status , Patient's Chart, lab work & pertinent test results, reviewed documented beta blocker date and time   Airway Mallampati: I       Dental no notable dental hx.    Pulmonary    Pulmonary exam normal        Cardiovascular hypertension, Pt. on medications and Pt. on home beta blockers Normal cardiovascular exam     Neuro/Psych negative psych ROS   GI/Hepatic negative GI ROS, Neg liver ROS,   Endo/Other  negative endocrine ROS  Renal/GU negative Renal ROS  negative genitourinary   Musculoskeletal   Abdominal Normal abdominal exam  (+)   Peds  Hematology  (+) Blood dyscrasia, anemia ,   Anesthesia Other Findings   Reproductive/Obstetrics                             Anesthesia Physical Anesthesia Plan  ASA: II  Anesthesia Plan: General   Post-op Pain Management:  Regional for Post-op pain   Induction: Intravenous  Airway Management Planned: Oral ETT  Additional Equipment:   Intra-op Plan:   Post-operative Plan: Extubation in OR  Informed Consent: I have reviewed the patients History and Physical, chart, labs and discussed the procedure including the risks, benefits and alternatives for the proposed anesthesia with the patient or authorized representative who has indicated his/her understanding and acceptance.   Dental advisory given  Plan Discussed with: CRNA and Surgeon  Anesthesia Plan Comments:         Anesthesia Quick Evaluation

## 2016-07-16 NOTE — Anesthesia Procedure Notes (Addendum)
Anesthesia Regional Block: TAP block   Pre-Anesthetic Checklist: ,, timeout performed, Correct Patient, Correct Site, Correct Laterality, Correct Procedure, Correct Position, site marked, Risks and benefits discussed,  Surgical consent,  Pre-op evaluation,  At surgeon's request and post-op pain management  Laterality: Right  Prep: Dura Prep       Needles:  Injection technique: Single-shot  Needle Type: Echogenic Stimulator Needle     Needle Length: 9cm  Needle Gauge: 21   Needle insertion depth: 2 cm   Additional Needles:   Procedures: ultrasound guided,,,,,,,,  Narrative:  Start time: 07/16/2016 1:05 PM End time: 07/16/2016 1:12 PM Injection made incrementally with aspirations every 5 mL.  Performed by: Personally  Anesthesiologist: Leilani Able

## 2016-07-16 NOTE — Transfer of Care (Signed)
Immediate Anesthesia Transfer of Care Note  Patient: Reginald Garrison  Procedure(s) Performed: Procedure(s): OPEN RIGHT INGUINAL HERNIA REPAIR AND REMOVAL OF RIGHT TESTICLE (Right) INSERTION OF MESH (Right)  Patient Location: PACU  Anesthesia Type:General and Regional  Level of Consciousness: awake, alert  and oriented  Airway & Oxygen Therapy: Patient Spontanous Breathing and Patient connected to nasal cannula oxygen  Post-op Assessment: Report given to RN, Post -op Vital signs reviewed and stable and Patient moving all extremities X 4  Post vital signs: Reviewed and stable  Last Vitals:  Vitals:   07/16/16 1315 07/16/16 1541  BP: (!) 146/71 138/86  Pulse: 60 65  Resp: 17 11  Temp:  36.1 C    Last Pain:  Vitals:   07/16/16 1541  TempSrc:   PainSc: 0-No pain      Patients Stated Pain Goal: 2 (07/16/16 1105)  Complications: No apparent anesthesia complications

## 2016-07-16 NOTE — Interval H&P Note (Signed)
History and Physical Interval Note:  07/16/2016 1:33 PM  Reginald Garrison  has presented today for surgery, with the diagnosis of Right inguinal hernia, incarcerated  The various methods of treatment have been discussed with the patient and family. After consideration of risks, benefits and other options for treatment, the patient has consented to  Procedure(s): OPEN RIGHT INGUINAL HERNIA REPAIR WITH MESH (Right) INSERTION OF MESH (Right) as a surgical intervention .  The patient's history has been reviewed, patient examined, no change in status, stable for surgery.  I have reviewed the patient's chart and labs.  Questions were answered to the patient's satisfaction.     Ernestene Mention

## 2016-07-16 NOTE — Anesthesia Procedure Notes (Signed)
Procedure Name: Intubation Date/Time: 07/16/2016 1:53 PM Performed by: Tressia Miners LEFFEW Pre-anesthesia Checklist: Patient identified, Patient being monitored, Timeout performed, Emergency Drugs available and Suction available Patient Re-evaluated:Patient Re-evaluated prior to inductionOxygen Delivery Method: Circle System Utilized Preoxygenation: Pre-oxygenation with 100% oxygen Intubation Type: IV induction Ventilation: Mask ventilation without difficulty and Oral airway inserted - appropriate to patient size Laryngoscope Size: Mac and 4 Grade View: Grade II Tube type: Oral Tube size: 7.5 mm Number of attempts: 1 Airway Equipment and Method: Stylet Placement Confirmation: ETT inserted through vocal cords under direct vision,  positive ETCO2 and breath sounds checked- equal and bilateral Secured at: 24 cm Tube secured with: Tape Dental Injury: Teeth and Oropharynx as per pre-operative assessment

## 2016-07-16 NOTE — Op Note (Signed)
Patient Name:           Reginald Garrison   Date of Surgery:        07/16/2016  Pre op Diagnosis:      Giant right inguinal hernia with recent incarceration  Post op Diagnosis:    Same  Procedure:                 Open repair right inguinal hernia                                       Implantation of mesh                                       Inguinal orchiectomy  Surgeon:                     Angelia Mould. Derrell Lolling, M.D., FACS  Assistant:                      OR staff   Indication for Assistant: n/a  Operative Indications:  This is a very pleasant 83 year old African-American man, here with his daughter and son. His PCP is Dr. Gabriel Earing in [place]. He was actually referred by Garlon Hatchet, PA, in the emergency department. I do not see that a physician saw the patient       He has a remote history of left inguinal hernia repair 20 years ago. No symptoms there. He's had a bulge in his right groin for many years. He wears a truss. It has slowly gotten larger. He recently had an episode of severe pain nausea and vomiting and went to the emergency department. The hernia was slowly reduced and he felt fine. CT scan showed a right inguinal hernia containing loops of colon and small bowel without obstruction. Incidentally found was infrarenal aorta dilated to 3.7 cm in follow-up in 2 years with ultrasound was recommended. Incidental bilateral common iliac aneurysms noted. 4 mm right lower lobe pulmonary nodule, indeterminate. 12 month follow-up CT if at high risk. Patient feels fine today and has no complaints today. He thought was given to the operation in the office today..      I advised him to undergo open repair of his giant right inguinal hernia with mesh in the near future and he agrees. He knows this is to prevent incarceration and obstruction and strangulation.  He will be scheduled for open repair of right inguinal hernia with mesh. I discussed the indications, details,  techniques, and numerous risk of the surgery with him and his children. I went over patient information booklets with drawings diagrams. He is aware of the risk of bleeding, infection, recurrence of the hernia, injury to the bowel or intestine or testicle, recurrent nerve damage is chronic pain, cardiac pulmonary thromboembolic problems, urinary retention. He understands all these issues. All his questions are answered. He agrees with this plan.  Operative Findings:       The patient had a very large, indirect, sliding right internal hernia.  One wall of the hernia was made up of the cecum and right colon which made up almost 50% of the circumference of the hernia sac.  There was intense chronic scar tissue, consistent with presence of the hernia for at least 2 or 3  decades.  The testicle and cord structures could not be preserved and had to be removed to accomplish a good repair.  I was ultimately able to reinforce the floor of the inguinal canal with an onlay graft of ultra Pro polypropylene mesh, Lichtenstein type repair  Procedure in Detail:          A TAPP  block was performed by the anesthesiologist in the holding area.  Following the induction of general endotracheal anesthesia the patient's abdomen and groins and genitalia were prepped and draped in a sterile fashion.  Intravenous antibiotics were given.  Surgical timeout was performed.  0.5% Marcaine was used as local infiltration anesthetic.     With gentle pressure I was able to reduce the hernia after about a minute or 2.  Irritated to come back out.  Transverse oblique incision was made overlying the right inguinal canal.  Dissection was carried down through Scarpa's fascia.  I dissected the large hernia bulge out medially.  Identified the external oblique laterally.  The external oblique fascia was incised laterally in the direction of its fibers, opening up what was left of the external inguinal ring.      What followed was a lengthy,  tedious dissection separating multiple tissue layers.  Ultimately I was able to separate the cord structures and the testicle from the hernia sac.  I ultimately was able to open the hernia sac and see that it was a sliding hernia.  I then closed the hernia sac with the Purstring suture of 2-0 Vicryl.  I clamped and divided the vas deferens and the cord vessels at the level of the internal ring.  The right testicle and the cord structures were passed off to see it healed and sent to pathology.  The very large indirect hernia sac was then slowly reduced.  Embrocated the muscles of the floor of the inguinal canal over this with a running suture of 2-0 Vicryl.  This held the reduction in place.  A 3 x 6" piece of ultra Pro mesh was brought to the field.  It was trimmed at the corners to make an elliptical shape with most of the mesh was used.  This mesh was sutured so as to generously overlap the fascia at the pubic tubercle.  Interrupted 2-0 Prolene sutures were placed through the mesh and through the inguinal ligament medial to lateral.  Numerous interrupted mattress sutures of 2-0 Prolene were placed medially, superiorly, and superiolaterally.  This provided very secure coverage and repair of the inguinal floor floor.  There was no bleeding.  The wound was irrigated.  The external oblique was closed with a running suture of 2-0 Vicryl placing the mesh deep to the external oblique.  Scarpa's fascia was closed with 3-0 Vicryl sutures and the skin closed with a running subcuticular 4-0 Monocryl and Dermabond.  The patient tolerated the procedure well was taken to PACU in stable condition.  EBL 25-35 mL.  Counts correct.  Complications none.     Angelia Mould. Derrell Lolling, M.D., FACS General and Minimally Invasive Surgery Breast and Colorectal Surgery  07/16/2016 3:31 PM

## 2016-07-16 NOTE — Anesthesia Postprocedure Evaluation (Addendum)
Anesthesia Post Note  Patient: Reginald Garrison  Procedure(s) Performed: Procedure(s) (LRB): OPEN RIGHT INGUINAL HERNIA REPAIR AND REMOVAL OF RIGHT TESTICLE (Right) INSERTION OF MESH (Right)  Patient location during evaluation: PACU Anesthesia Type: General Level of consciousness: awake Pain management: pain level controlled Vital Signs Assessment: post-procedure vital signs reviewed and stable Respiratory status: spontaneous breathing Cardiovascular status: stable Postop Assessment: no signs of nausea or vomiting Anesthetic complications: no        Last Vitals:  Vitals:   07/16/16 1555 07/16/16 1610  BP: 125/77 (!) 152/88  Pulse: 71 66  Resp: 17 15  Temp:      Last Pain:  Vitals:   07/16/16 1610  TempSrc:   PainSc: 0-No pain   Pain Goal: Patients Stated Pain Goal: 2 (07/16/16 1105)               Channell Quattrone JR,JOHN Susann Givens

## 2016-07-17 ENCOUNTER — Encounter (HOSPITAL_COMMUNITY): Payer: Self-pay | Admitting: General Surgery

## 2016-07-17 DIAGNOSIS — K4030 Unilateral inguinal hernia, with obstruction, without gangrene, not specified as recurrent: Secondary | ICD-10-CM | POA: Diagnosis not present

## 2016-07-17 MED ORDER — TRAMADOL HCL 50 MG PO TABS: 50.0000 mg | ORAL_TABLET | Freq: Four times a day (QID) | ORAL | 0 refills | Status: AC | PRN

## 2016-07-17 NOTE — Progress Notes (Signed)
Patient discharged to home with instructions and prescription. 

## 2016-07-17 NOTE — Discharge Summary (Signed)
Patient ID: Reginald Garrison [id number] 83 y.o. 1929/10/26  Admit date: 07/16/2016  Discharge date and time: 07/17/2016  Admitting Physician: Reginald Garrison  Discharge Physician: Reginald Garrison  Admission Diagnoses: Right inguinal hernia  Discharge Diagnoses: Incarcerated, sliding, indirect right inguinal hernia                                         Abdominal aortic aneurysm                                         Hypertension  Operations: Procedure(s): OPEN RIGHT INGUINAL HERNIA REPAIR AND REMOVAL OF RIGHT TESTICLE INSERTION OF MESH  Admission Condition: good  Discharged Condition: good  Indication for Admission: This is a very pleasant 83 year old African-American man. His PCP is Dr. Gabriel Garrison in [place].  He has a remote history of left inguinal hernia repair 20 years ago. No symptoms there. He's had a bulge in his right groin for many years, decades in fact. He wears a truss. It has slowly gotten larger. He recently had an episode of severe pain nausea and vomiting and went to the emergency department. The hernia was slowly reduced and he felt fine. CT scan showed a right inguinal hernia containing loops of colon and small bowel without obstruction. Incidentally found was infrarenal aorta dilated to 3.7 cm in follow-up in 2 years with ultrasound was recommended. Incidental bilateral common iliac aneurysms noted. 4 mm right lower lobe pulmonary nodule, indeterminate. 12 month follow-up CT if at high risk. Patient feels fine today and has no complaints today. He thought was given to the operation in the office today.. I advised him to undergo open repair of his giant right inguinal hernia with mesh in the near future and he agrees. He knows this is to prevent incarceration and obstruction and strangulation.  He will be scheduled for open repair of right inguinal hernia with mesh. I discussed the indications, details, techniques, and numerous  risk of the surgery with him and his children. I went over patient information booklets with drawings diagrams. He is aware of the risk of bleeding, infection, recurrence of the hernia, injury to the bowel or intestine or testicle, recurrent nerve damage is chronic pain, cardiac pulmonary thromboembolic problems, urinary retention. He understands all these issues. All his questions are answered. He agrees with this plan.  Hospital Course: On the day of admission he was brought to the operating room and underwent right groin and scrotal exploration through a right inguinal incision.    The patient had a very large, indirect, sliding right internal hernia.  One wall of the hernia was made up of the cecum and right colon which made up almost 50% of the circumference of the hernia sac.  There was intense chronic scar tissue, consistent with presence of the hernia for at least 2 or 3 decades.  The testicle and cord structures could not be preserved and had to be removed to accomplish a good repair.  I was ultimately able to reinforce the floor of the inguinal canal with an onlay graft of ultra Pro polypropylene mesh, Lichtenstein type repair    He was observed overnight and did very well.  He tolerated his diet.  He was able to get out of bed.  He had no trouble voiding.  He had no wound problems.  He had one episode of lightheadedness while sitting in a chair after he took a hydrocodone tablet.     The morning after surgery the patient looked and felt well.  Mental status is normal.  He was quite comfortable.  The right groin incision looked good.  Very soft.  No bleeding or hematoma.  Repair intact.  Vital signs stable.     He was given instructions in diet and activities.  He was given a prescription for tramadol for pain since he had the lightheadedness with opioids.  He will return to see me in the office in 2-3 weeks.  I told him to discontinue the use of the truss.   Consults: None  Significant  Diagnostic Studies: Surgical pathology, pending  Treatments: surgery: Open repair incarcerated writing oral hernia with mesh, right inguinal orchiectomy  Disposition: Home  Patient Instructions:  Allergies as of 07/17/2016      Reactions   Contrast Media [iodinated Diagnostic Agents] Hives      Medication List    TAKE these medications   aspirin EC 81 MG tablet Take 81 mg by mouth daily.   lisinopril 20 MG tablet Commonly known as:  PRINIVIL,ZESTRIL Take 20 mg by mouth every evening.   metoprolol succinate 25 MG 24 hr tablet Commonly known as:  TOPROL-XL Take 25 mg by mouth daily.   ondansetron 4 MG disintegrating tablet Commonly known as:  ZOFRAN ODT Take 1 tablet (4 mg total) by mouth every 8 (eight) hours as needed for nausea.   oxyCODONE-acetaminophen 5-325 MG tablet Commonly known as:  PERCOCET Take 1 tablet by mouth every 4 (four) hours as needed.   traMADol 50 MG tablet Commonly known as:  ULTRAM Take 1 tablet (50 mg total) by mouth every 6 (six) hours as needed for moderate pain.       Activity: Lots of walking.  No lifting more than 15 pounds.  No driving. Diet: low fat, low cholesterol diet Wound Care: none needed  Follow-up:  With Dr. Derrell Garrison in 2-3 weeks.  Signed: Angelia Garrison. Reginald Garrison, M.D., FACS General and minimally invasive surgery Breast and Colorectal Surgery  07/17/2016, 6:39 AM

## 2016-07-17 NOTE — Discharge Instructions (Signed)
[facility] _______[facility] ° °UMBILICAL OR INGUINAL HERNIA REPAIR: POST OP INSTRUCTIONS ° °Always review your discharge instruction sheet given to you by the facility where your surgery was performed. °IF YOU HAVE DISABILITY OR FAMILY LEAVE FORMS, YOU MUST BRING THEM TO THE OFFICE FOR PROCESSING.   °DO NOT GIVE THEM TO YOUR DOCTOR. ° °1. A  prescription for pain medication may be given to you upon discharge.  Take your pain medication as prescribed, if needed.  If narcotic pain medicine is not needed, then you may take acetaminophen (Tylenol) or ibuprofen (Advil) as needed. °2. Take your usually prescribed medications unless otherwise directed. °If you need a refill on your pain medication, please contact your pharmacy.  They will contact our office to request authorization. Prescriptions will not be filled after 5 pm or on week-ends. °3. You should follow a light diet the first 24 hours after arrival home, such as soup and crackers, etc.  Be sure to include lots of fluids daily.  Resume your normal diet the day after surgery. °4.Most patients will experience some swelling and bruising around the umbilicus or in the groin and scrotum.  Ice packs and reclining will help.  Swelling and bruising can take several days to resolve.  °6. It is common to experience some constipation if taking pain medication after surgery.  Increasing fluid intake and taking a stool softener (such as Colace) will usually help or prevent this problem from occurring.  A mild laxative (Milk of Magnesia or Miralax) should be taken according to package directions if there are no bowel movements after 48 hours. °7. Unless discharge instructions indicate otherwise, you may remove your bandages 24-48 hours after surgery, and you may shower at that time.  You may have steri-strips (small skin tapes) in place directly over the incision.  These strips should be left on the skin for 7-10 days.  If your surgeon used skin glue on the  incision, you may shower in 24 hours.  The glue will flake off over the next 2-3 weeks.  Any sutures or staples will be removed at the office during your follow-up visit. °8. ACTIVITIES:  You may resume regular (light) daily activities beginning the next day--such as daily self-care, walking, climbing stairs--gradually increasing activities as tolerated.  You may have sexual intercourse when it is comfortable.  Refrain from any heavy lifting or straining until approved by your doctor. ° °a.You may drive when you are no longer taking prescription pain medication, you can comfortably wear a seatbelt, and you can safely maneuver your car and apply brakes. °b.RETURN TO WORK:   °_____________________________________________ ° °9.You should see your doctor in the office for a follow-up appointment approximately 2-3 weeks after your surgery.  Make sure that you call for this appointment within a day or two after you arrive home to insure a convenient appointment time. °10.OTHER INSTRUCTIONS: _________________________ °   _____________________________________ ° °WHEN TO CALL YOUR DOCTOR: °1. Fever over 101.0 °2. Inability to urinate °3. Nausea and/or vomiting °4. Extreme swelling or bruising °5. Continued bleeding from incision. °6. Increased pain, redness, or drainage from the incision ° °The clinic staff is available to answer your questions during regular business hours.  Please don’t hesitate to call and ask to speak to one of the nurses for clinical concerns.  If you have a medical emergency, go to the nearest emergency room or call 911.  A surgeon from [facility] is always on call at the hospital ° ° °  [address], [address], [place], [place]  [address] ? ° [address] [address], [place], [place]   [address] °[phone] ? [phone] ? FAX [phone] °Web site: [email or web address] °

## 2016-08-28 NOTE — Addendum Note (Signed)
Addendum  created 08/28/16 1207 by Merrit Friesen, MD   Sign clinical note    

## 2017-08-21 IMAGING — CT CT ABD-PELV W/ CM
2 of 5 series · 14 of 46 positions shown, 16 images · IV contrast (Omni 300)
Comparison: None.

CLINICAL DATA: Initial evaluation for right inguinal hernia,
vomiting.

EXAM:
CT ABDOMEN AND PELVIS WITH CONTRAST
TECHNIQUE: Multidetector CT imaging of the abdomen and pelvis was performed
using the standard protocol following bolus administration of
intravenous contrast.
CONTRAST:  100mL [id number] IOPAMIDOL ([id number]) INJECTION 61%

[Series 3: a/p w/ 5mm · axial · 0.73mm/px · z∈[+762,+1152]mm · 11 of 90 slices shown, 13 images]
[im 6/90  soft-tissue]
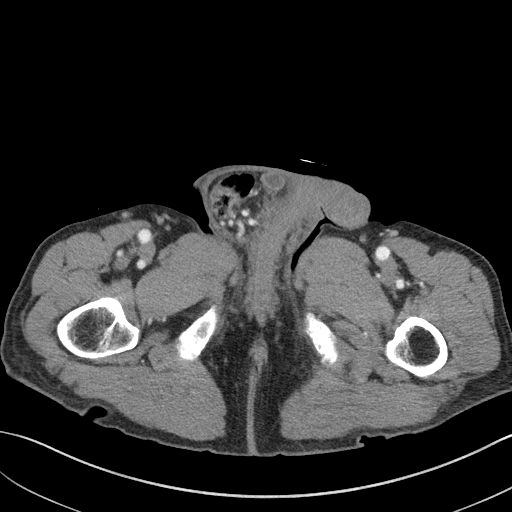
[im 6/90  bone]
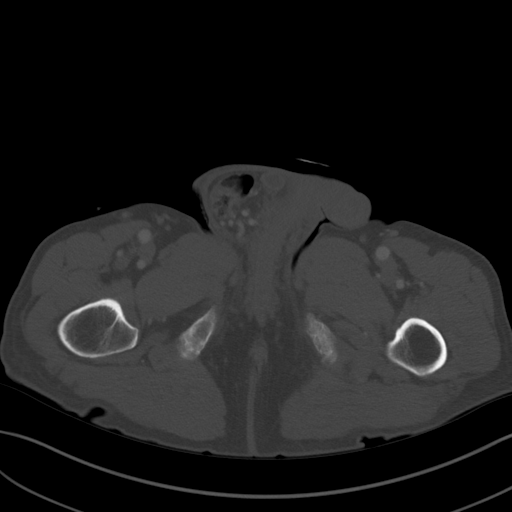
[im 16/90  soft-tissue]
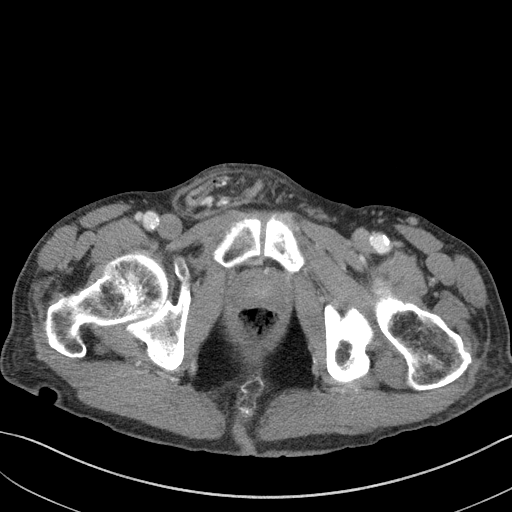
[im 21/90  soft-tissue]
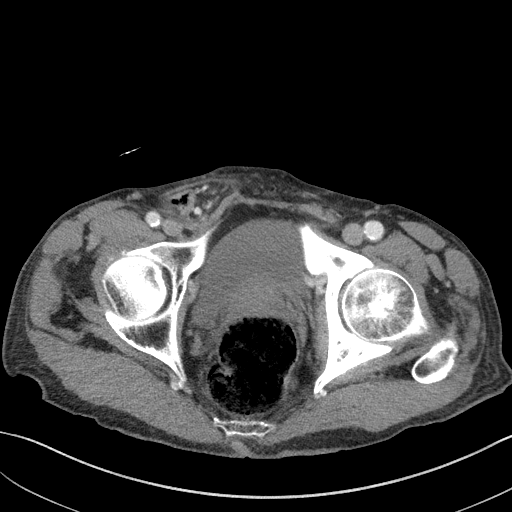
[im 32/90  soft-tissue]
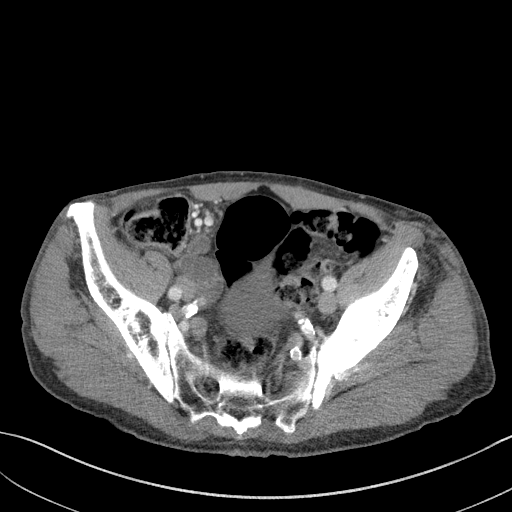
[im 37/90  soft-tissue]
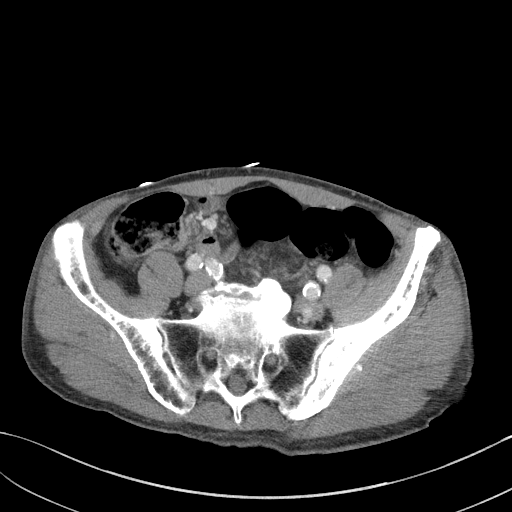
[im 48/90  soft-tissue]
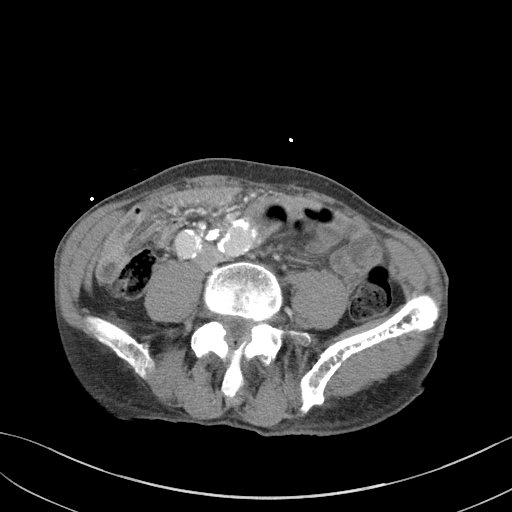
[im 53/90  soft-tissue]
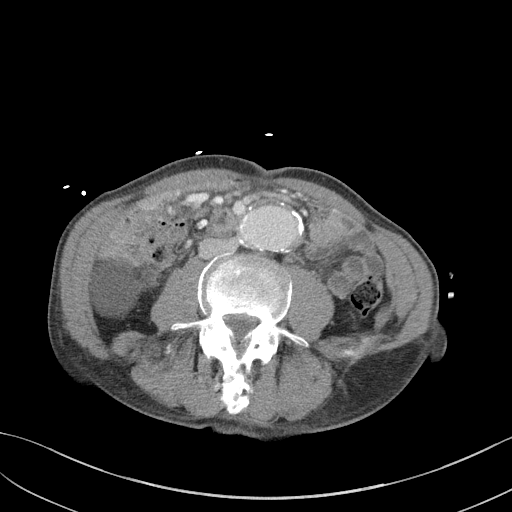
[im 58/90  soft-tissue]
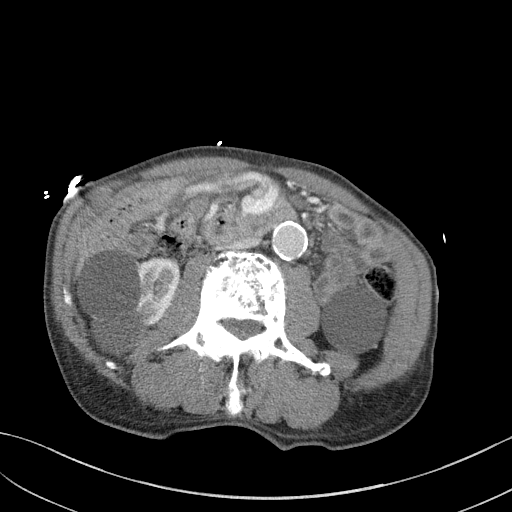
[im 69/90  soft-tissue]
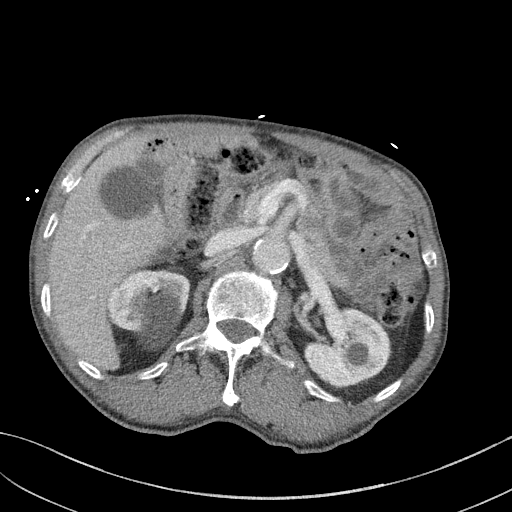
[im 69/90  bone]
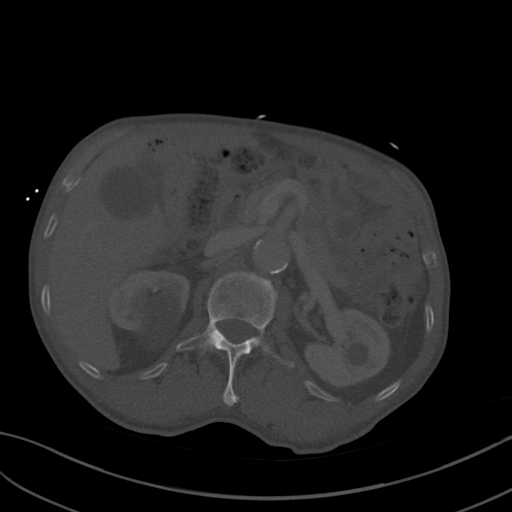
[im 74/90  soft-tissue]
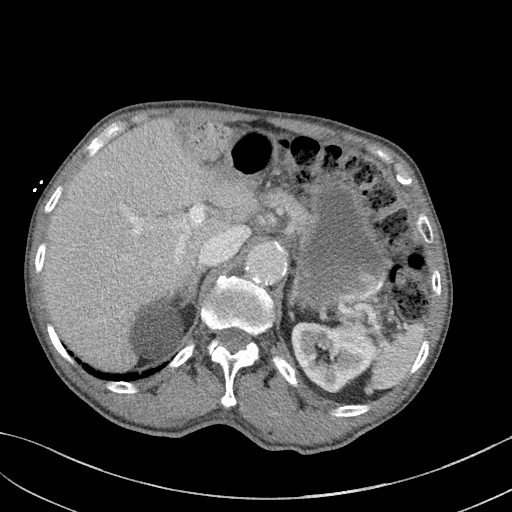
[im 84/90  soft-tissue]
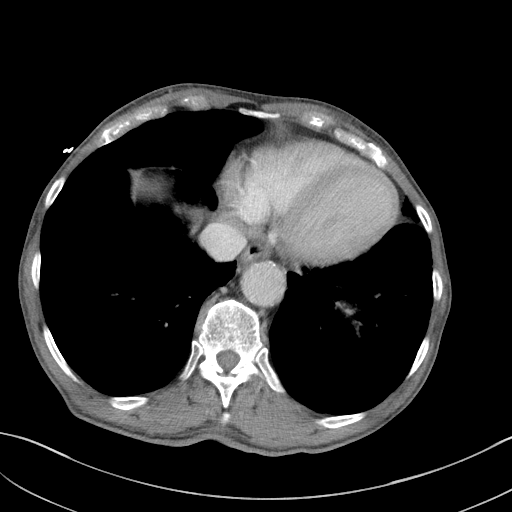

[Series 6: a/p w/ cor · coronal · 0.71mm/px · 3 of 134 slices shown]
[im 45/134  soft-tissue]
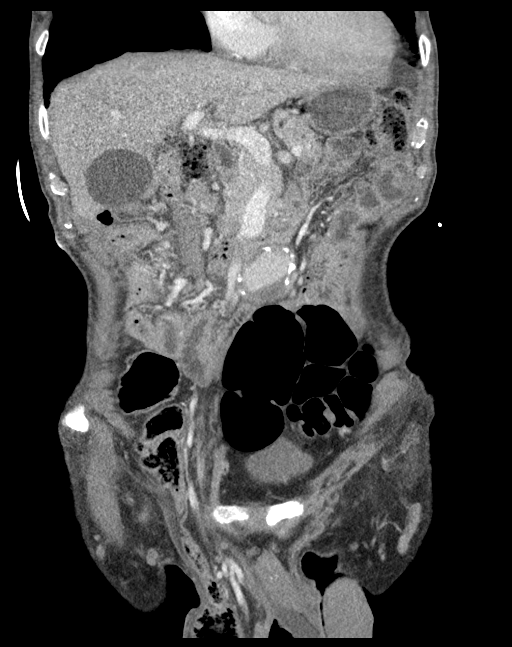
[im 60/134  soft-tissue]
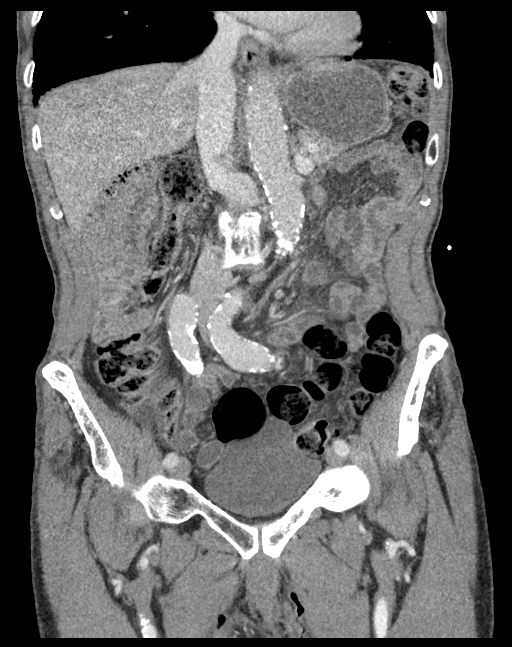
[im 74/134  soft-tissue]
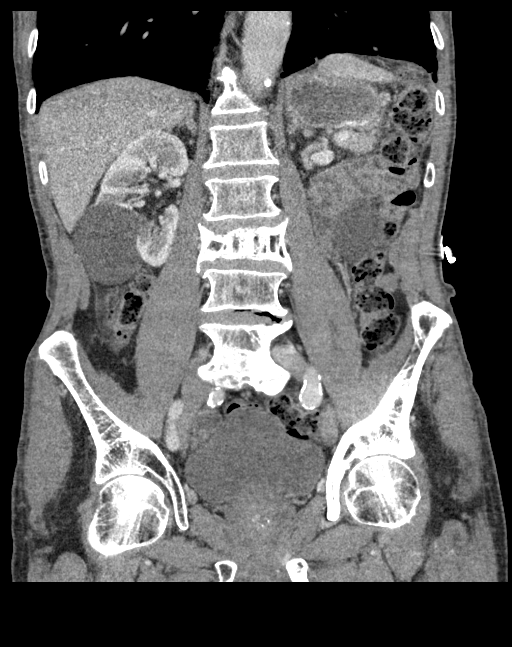

[14 of 46 positions shown; findings below may reference images not displayed]

FINDINGS: Lower chest: Scattered atelectatic changes and/or fibrosis present
within the visualized lung bases. 4 mm nodule present within the
right lower lobe (series 4, image 5). Visualized lung bases are
otherwise clear.

Hepatobiliary: 12 mm hypodensity within the left hepatic lobe noted,
likely a small cyst. Section 4.8 cm cyst present within the inferior
right hepatic lobe. Few additional tiny subcentimeter hypodensities
too small the characterize, but may reflect small cysts as well.
Liver otherwise unremarkable. Gallbladder within normal limits. No
biliary dilatation.

Pancreas: Pancreas within normal limits.

Spleen: Spleen within normal limits.

Adrenals/Urinary Tract: Kidneys equal in size with symmetric
enhancement. Multiple bilateral renal cysts noted. No
nephrolithiasis or hydronephrosis. No focal enhancing renal mass. No
hydroureter. Bladder within normal limits.

Stomach/Bowel: Stomach within normal limits. Small bowel of normal
caliber without acute inflammatory changes. No evidence for small
bowel obstruction. Right inguinal hernia containing a portion of
colon and loop of ileum present within the right inguinal region
(series 3, image 90). No associated obstruction. Small amount of
probable free fluid within the hernia sac without additional
inflammatory changes. No abnormal wall thickening, mucosal
enhancement, or inflammatory fat stranding seen about the bowels.
Moderate retained stool within the colon, which may reflect
constipation.

Vascular/Lymphatic: Extensive aorto bi-iliac atherosclerotic
disease. Aneurysmal dilatation of the infrarenal aorta up to 3.7 cm
in diameter. Aneurysm dilatation of the right common iliac artery up
to 2.3 cm. Left common iliac artery dilated up to 2.4 cm.

No pathologically enlarged intra-abdominal or pelvic lymph nodes.

Reproductive: Prostate within normal limits.

Other: No free air or fluid.

Musculoskeletal: No acute osseous abnormality. No worrisome lytic or
blastic osseous lesions. Asymmetric coarse trabecular thickening
involving the left hemipelvis most likely related to underlying
Paget's disease. Similar changes involve the L3 vertebral body as
well as its posterior elements. Height loss at the L3 level appears
chronic. No associated soft tissue mass to suggest malignant
degeneration.
IMPRESSION: 1. Right inguinal hernia containing loops of colon and ileum without
associated obstruction. Probable small amount of associated free
fluid within the hernia sac without additional inflammatory changes.
2. No other acute intra-abdominal or pelvic process identified.
3. **An incidental finding of potential clinical significance has
been found. Aneurysmal dilatation of the infrarenal aorta up to
cm. Recommend followup by ultrasound in 2 years. This recommendation
follows ACR consensus guidelines: White Paper of the ACR Incidental
Findings Committee II on Vascular Findings. [HOSPITAL] 7405;
[DATE].**
4. Additional bilateral common iliac artery aneurysms as above.
5. Moderate amount of retained stool within the colon, which may
reflect constipation.
6. Scattered cysts involving the liver and bilateral kidneys as
above.
7. Pagetoid changes involving the left hemipelvis and L3 vertebral
body. Height loss at the L3 level appears chronic.
8. 4 mm right lower lobe pulmonary nodule, indeterminate. No
follow-up needed if patient is low-risk. Non-contrast chest CT can
be considered in 12 months if patient is high-risk. This
recommendation follows the consensus statement: Guidelines for
Management of Incidental Pulmonary Nodules Detected on CT Images:

## 2019-05-13 ENCOUNTER — Ambulatory Visit: Payer: Medicare Other | Attending: Internal Medicine

## 2019-05-14 ENCOUNTER — Ambulatory Visit: Payer: Medicare Other | Attending: Internal Medicine

## 2019-05-14 DIAGNOSIS — Z23 Encounter for immunization: Secondary | ICD-10-CM | POA: Insufficient documentation

## 2019-05-14 NOTE — Progress Notes (Signed)
   Covid-19 Vaccination Clinic  Name:  Reginald Garrison    MRN: [id number] DOB: 09/16/1929  05/14/2019  Reginald Garrison was observed post Covid-19 immunization for 15 minutes without incidence. He was provided with Vaccine Information Sheet and instruction to access the V-Safe system.   Reginald Garrison was instructed to call 911 with any severe reactions post vaccine: Marland Kitchen Difficulty breathing  . Swelling of your face and throat  . A fast heartbeat  . A bad rash all over your body  . Dizziness and weakness    Immunizations Administered    Name Date Dose VIS Date Route   Pfizer COVID-19 Vaccine 05/14/2019  2:58 PM 0.3 mL 03/03/2019 Intramuscular   Manufacturer: [employer], [employer]   Lot: [id number]   NDC: [address]-5

## 2019-06-07 ENCOUNTER — Ambulatory Visit: Payer: Medicare Other | Attending: Internal Medicine

## 2019-06-07 DIAGNOSIS — Z23 Encounter for immunization: Secondary | ICD-10-CM

## 2019-06-07 NOTE — Progress Notes (Signed)
   Covid-19 Vaccination Clinic  Name:  Reginald Garrison    MRN: [id number] DOB: January 24, 1930  06/07/2019  Mr. Arterberry was observed post Covid-19 immunization for 15 minutes without incident. He was provided with Vaccine Information Sheet and instruction to access the V-Safe system.   Mr. Grisby was instructed to call 911 with any severe reactions post vaccine: Marland Kitchen Difficulty breathing  . Swelling of face and throat  . A fast heartbeat  . A bad rash all over body  . Dizziness and weakness   Immunizations Administered    Name Date Dose VIS Date Route   Pfizer COVID-19 Vaccine 06/07/2019  9:41 AM 0.3 mL 03/03/2019 Intramuscular   Manufacturer: [employer], [employer]   Lot: [id number]   NDC: [address]-9

## 2022-08-30 ENCOUNTER — Inpatient Hospital Stay: Admit: 2022-08-30 | Payer: Medicare Other | Admitting: Student

## 2022-08-30 SURGERY — FIXATION, FRACTURE, INTERTROCHANTERIC, WITH INTRAMEDULLARY ROD
Anesthesia: General | Laterality: Left

## 2024-01-05 NOTE — Addendum Note (Signed)
 Addended by: SANDERS, KAITLYN on: 01/05/2024 09:01 AM  Modules accepted: Orders

## 2024-01-19 NOTE — Progress Notes (Signed)
 Attempted to contact the patient to discuss upcoming appointments and recommended health maintenance screenings for the year.  A [facility] Specialist has reviewed the patient's chart to identify any gaps in care and outstanding preventive health needs. However, we were unable to reach the patient to review or schedule the necessary follow-up appointments and screenings.  NA  Left message: yes   MyChart message sent: no   Voicemail was left including callback details and our hours of operation.  Comments:

## 2024-02-24 ENCOUNTER — Telehealth (INDEPENDENT_AMBULATORY_CARE_PROVIDER_SITE_OTHER): Payer: Self-pay

## 2024-02-24 NOTE — Telephone Encounter (Signed)
 Patients son left message 02/24/24 at 9:45 asking about a referral sent here for his father. NOT sure son is on HIPAA.

## 2024-03-07 ENCOUNTER — Ambulatory Visit (INDEPENDENT_AMBULATORY_CARE_PROVIDER_SITE_OTHER): Admitting: Physician Assistant

## 2024-03-07 ENCOUNTER — Encounter (INDEPENDENT_AMBULATORY_CARE_PROVIDER_SITE_OTHER): Payer: Self-pay | Admitting: Physician Assistant

## 2024-03-07 VITALS — BP 122/82 | HR 70

## 2024-03-07 DIAGNOSIS — H9193 Unspecified hearing loss, bilateral: Secondary | ICD-10-CM

## 2024-03-07 NOTE — Progress Notes (Signed)
 Patient and his son

## 2024-03-07 NOTE — Progress Notes (Signed)
 Dear Dr. Willian, Here is my assessment for our mutual patient, Reginald Garrison. Thank you for allowing me the opportunity to care for your patient. Please do not hesitate to contact me should you have any other questions. Sincerely, Chyrl Cohen PA-C  Otolaryngology Clinic Note Referring provider: Dr. Willian HPI:  Reginald Garrison is a 89 y.o. male kindly referred by Dr. Willian   Discussed the use of AI scribe software for clinical note transcription with the patient, who gave verbal consent to proceed.  History of Present Illness    89 year old gentleman presents today for hearing aids.  He is accompanied by his son.  He notes a longstanding history of progressive hearing loss.  No history of ear trauma, no history recurrent ear infections.  He was seen previously by Dr. Jesus who recommended hearing aids.  He notes that he has insurance would not cover hearing aids through them.  His primary care referred him to our office for hearing aids.  His son is confused as he is already had medical evaluation and thought this visit was for hearing aids.       Independent Review of Additional Tests or Records:     PMH/Meds/All/SocHx/FamHx/ROS:   Past Medical History:  Diagnosis Date   Arthritis    Coronary artery disease    Headache    Hernia of abdominal cavity    Hypertension    Incarcerated right inguinal hernia 07/16/2016   Inguinal hernia    right   Pneumonia    PONV (postoperative nausea and vomiting)    Wears glasses      Past Surgical History:  Procedure Laterality Date   COLONOSCOPY     HERNIA REPAIR     left groin   INGUINAL HERNIA REPAIR  07/16/2016   INGUINAL HERNIA REPAIR Right 07/16/2016   Procedure: OPEN RIGHT INGUINAL HERNIA REPAIR AND REMOVAL OF RIGHT TESTICLE;  Surgeon: Elon Pacini, MD;  Location: [facility] OR;  Service: General;  Laterality: Right;   INSERTION OF MESH Right 07/16/2016   Procedure: INSERTION OF MESH;  Surgeon: Elon Pacini, MD;  Location: [facility] OR;  Service:  General;  Laterality: Right;    History reviewed. No pertinent family history.   Social Connections: Moderately Integrated (02/16/2023)   Received from [facility]   Social Network    How would you rate your social network (family, work, friends)?: Adequate participation with social networks     Current Medications[1]   Physical Exam:   BP 122/82   Pulse 70   SpO2 92%   Pertinent Findings  CN II-XII grossly intact Bilateral EAC clear and TM intact with well pneumatized middle ear spaces Anterior rhinoscopy: Septum midline; bilateral inferior turbinates with no hypertrophy No obviously palpable neck masses/lymphadenopathy/thyromegaly No respiratory distress or stridor        Seprately Identifiable Procedures:  None  Impression & Plans:  Reginald Garrison is a 89 y.o. male with the following   Assessment and Plan    Bilateral sensorineural hearing loss-  Bilateral sensorineural hearing loss.  He does have minimal asymmetry in the lower frequencies.  I do think the best course of action would be hearing aids.  Unfortunately the patient's son made this appointment under the understanding he was getting hearing aids, he reportedly spoke to our front office and told them he is attempting to get hearing aids and has had audiological evaluation already.  There was some miscommunication on our end and through the referral that was placed for hearing loss.  I  gave him a list of local providers that provide hearing aids, I gave him a clearance form.  I advised him to reach out to his insurance company to verify they will cover hearing aids at the given facilities and to follow-up with them.  Him happy to see him back in the office for any further questions or concerns he may have.           - f/u PRN   Thank you for allowing me the opportunity to care for your patient. Please do not hesitate to contact me should you have any other questions.  Sincerely, Chyrl Cohen PA-C [facility] Phone: [phone] Fax: [phone]  03/07/2024, 10:03 AM        [1]  Current Outpatient Medications:    amLODipine (NORVASC) 5 MG tablet, Take 5 mg by mouth daily., Disp: , Rfl:    donepezil (ARICEPT) 10 MG tablet, Take 10 mg by mouth daily., Disp: , Rfl:    ferrous sulfate 325 (65 FE) MG tablet, Take 325 mg by mouth daily., Disp: , Rfl:    lisinopril  (PRINIVIL ,ZESTRIL ) 20 MG tablet, Take 20 mg by mouth every evening., Disp: , Rfl:    memantine (NAMENDA) 10 MG tablet, Take 10 mg by mouth 2 (two) times daily., Disp: , Rfl:    metoprolol  succinate (TOPROL -XL) 25 MG 24 hr tablet, Take 25 mg by mouth daily., Disp: , Rfl:    ondansetron  (ZOFRAN  ODT) 4 MG disintegrating tablet, Take 1 tablet (4 mg total) by mouth every 8 (eight) hours as needed for nausea., Disp: 10 tablet, Rfl: 0   aspirin EC 81 MG tablet, Take 81 mg by mouth daily. (Patient not taking: Reported on 03/07/2024), Disp: , Rfl:    oxyCODONE -acetaminophen  (PERCOCET) 5-325 MG tablet, Take 1 tablet by mouth every 4 (four) hours as needed. (Patient not taking: Reported on 03/07/2024), Disp: 20 tablet, Rfl: 0   traMADol  (ULTRAM ) 50 MG tablet, Take 1 tablet (50 mg total) by mouth every 6 (six) hours as needed for moderate pain. (Patient not taking: Reported on 03/07/2024), Disp: 20 tablet, Rfl: 0

## 2024-03-17 ENCOUNTER — Telehealth (INDEPENDENT_AMBULATORY_CARE_PROVIDER_SITE_OTHER): Payer: Self-pay

## 2024-03-17 NOTE — Telephone Encounter (Signed)
 I sent an email to [facility] Fee Billing today requesting them to refund patient his $20.00 copay from his visit with Chyrl Cohen, PA-C on 03/07/24.  Chyrl did a no charge for this date of service as the patient's son thought the patient was coming in to get hearing aids which we currently are not providing to our patients.  There was a breakdown in communication with the referral that was sent and the scheduling of the patient's appointment.  I will call the patient to advise I am reaching out to billing to request a refund for this date of service.
